# Patient Record
Sex: Male | Born: 1995 | Hispanic: Yes | Marital: Single
Health system: Southern US, Community
[De-identification: ages and names within clinical notes are randomized; demographics above are authoritative.]

---

## 2019-11-30 ENCOUNTER — Emergency Department (HOSPITAL_COMMUNITY): Payer: Self-pay

## 2019-11-30 ENCOUNTER — Encounter (HOSPITAL_COMMUNITY): Payer: Self-pay | Admitting: Emergency Medicine

## 2019-11-30 ENCOUNTER — Emergency Department (HOSPITAL_COMMUNITY)
Admission: EM | Admit: 2019-11-30 | Discharge: 2019-11-30 | Disposition: A | Discharge status: Home / Self Care | Payer: Self-pay | Attending: Emergency Medicine | Admitting: Emergency Medicine

## 2019-11-30 DIAGNOSIS — Y990 Civilian activity done for income or pay: Secondary | ICD-10-CM | POA: Insufficient documentation

## 2019-11-30 DIAGNOSIS — W270XXA Contact with workbench tool, initial encounter: Secondary | ICD-10-CM | POA: Insufficient documentation

## 2019-11-30 DIAGNOSIS — Y9289 Other specified places as the place of occurrence of the external cause: Secondary | ICD-10-CM | POA: Insufficient documentation

## 2019-11-30 DIAGNOSIS — Y9389 Activity, other specified: Secondary | ICD-10-CM | POA: Insufficient documentation

## 2019-11-30 DIAGNOSIS — W500XXA Accidental hit or strike by another person, initial encounter: Secondary | ICD-10-CM | POA: Insufficient documentation

## 2019-11-30 DIAGNOSIS — S61212A Laceration without foreign body of right middle finger without damage to nail, initial encounter: Secondary | ICD-10-CM | POA: Insufficient documentation

## 2019-11-30 DIAGNOSIS — Z23 Encounter for immunization: Secondary | ICD-10-CM | POA: Insufficient documentation

## 2019-11-30 MED ORDER — CEFAZOLIN SODIUM-DEXTROSE 1-4 GM/50ML-% IV SOLN
1.0000 g | Freq: Once | INTRAVENOUS | Status: DC
  Administered 2019-11-30: 1 g via INTRAVENOUS
  Filled 2019-11-30: qty 50

## 2019-11-30 MED ORDER — TETANUS-DIPHTH-ACELL PERTUSSIS 5-2.5-18.5 LF-MCG/0.5 IM SUSP
0.5000 mL | Freq: Once | INTRAMUSCULAR | Status: AC
  Administered 2019-11-30: 0.5 mL via INTRAMUSCULAR
  Filled 2019-11-30: qty 0.5

## 2019-11-30 MED ORDER — LIDOCAINE HCL (PF) 1 % IJ SOLN
10.0000 mL | Freq: Once | INTRAMUSCULAR | Status: AC
  Administered 2019-11-30: 10 mL
  Filled 2019-11-30: qty 30

## 2019-11-30 MED ORDER — CEFAZOLIN SODIUM 1 G IJ SOLR: 1.0000 g | Freq: Once | INTRAMUSCULAR | Status: DC

## 2019-11-30 MED ORDER — CEPHALEXIN 500 MG PO CAPS
500.0000 mg | ORAL_CAPSULE | Freq: Two times a day (BID) | ORAL | 0 refills | Status: AC
Start: 2019-11-30 — End: 2019-12-09
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
## 2019-11-30 NOTE — ED Notes (Signed)
Ortho tech called to apply finger splint.
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
## 2019-11-30 NOTE — Progress Notes (Signed)
Orthopedic Tech Progress Note Patient Details:  Kuper Rennels [facility] March 17, 1996 [id number]  Ortho Devices Type of Ortho Device: Finger splint Ortho Device/Splint Location: right Ortho Device/Splint Interventions: Application   Post Interventions Patient Tolerated: Well Instructions Provided: Care of device   Saul Fordyce 11/30/2019, 3:55 PM

## 2019-11-30 NOTE — ED Notes (Signed)
Discussed discharge instructions and medications with patient. IV removed. All questions addressed. Patient transported home via car by his friend. 

## 2019-11-30 NOTE — Discharge Instructions (Signed)
For a right middle finger laceration and fracture. Do not shower for the first day, tomorrow you can wash it lightly with soap and water. You can alternate between using Tylenol and ibuprofen for pain. It is important that you ice the wound and elevated to keep down the swelling. You were also prescribed Keflex please take as prescribed.  You were referred to a hand surgeon please follow-up with him within the next 7 to 10 days as you need to get the stitches removed. If you do not want to see the surgeon you may see one that is closer to you in [facility] please follow-up with a hand surgeon.  You need to watch your finger and if you experience increased pain, increased redness, or develop a fever need to come back to emergency department for further evaluation

## 2021-06-13 IMAGING — DX DG HAND COMPLETE 3+V*R*
3 series · 3 of 3 positions shown · non-contrast
Comparison: No prior.

CLINICAL DATA: Right hand injury.

EXAM:
RIGHT HAND - COMPLETE 3+ VIEW

[hand ap]
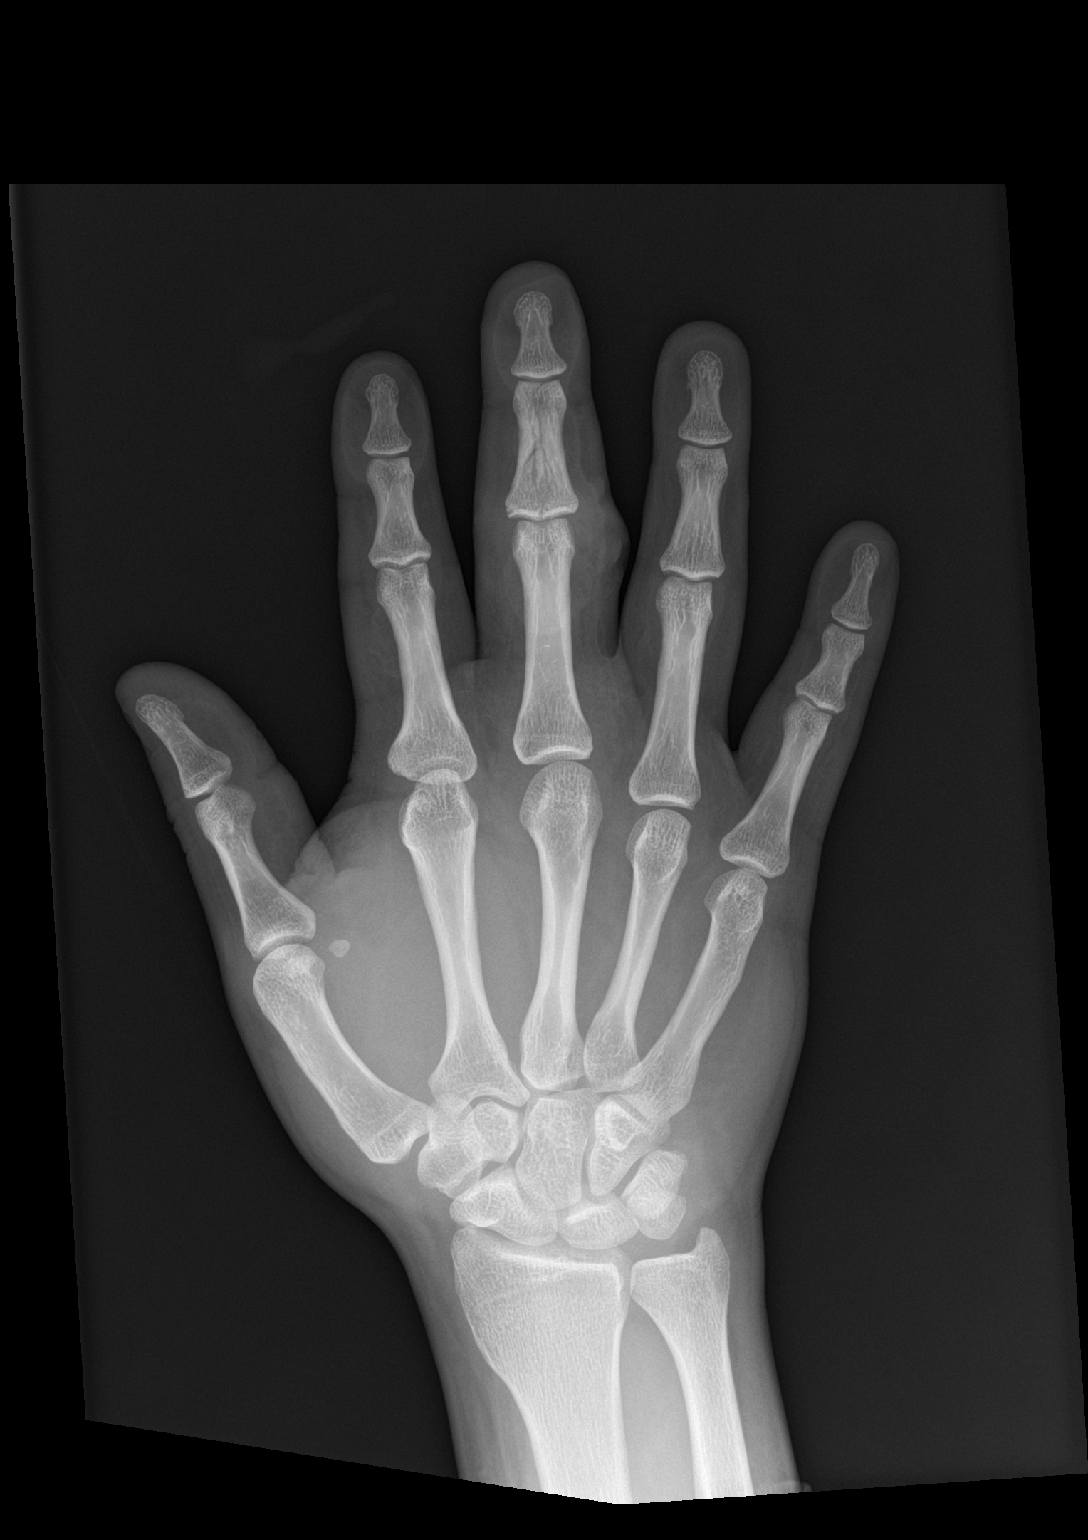

[hand obl]
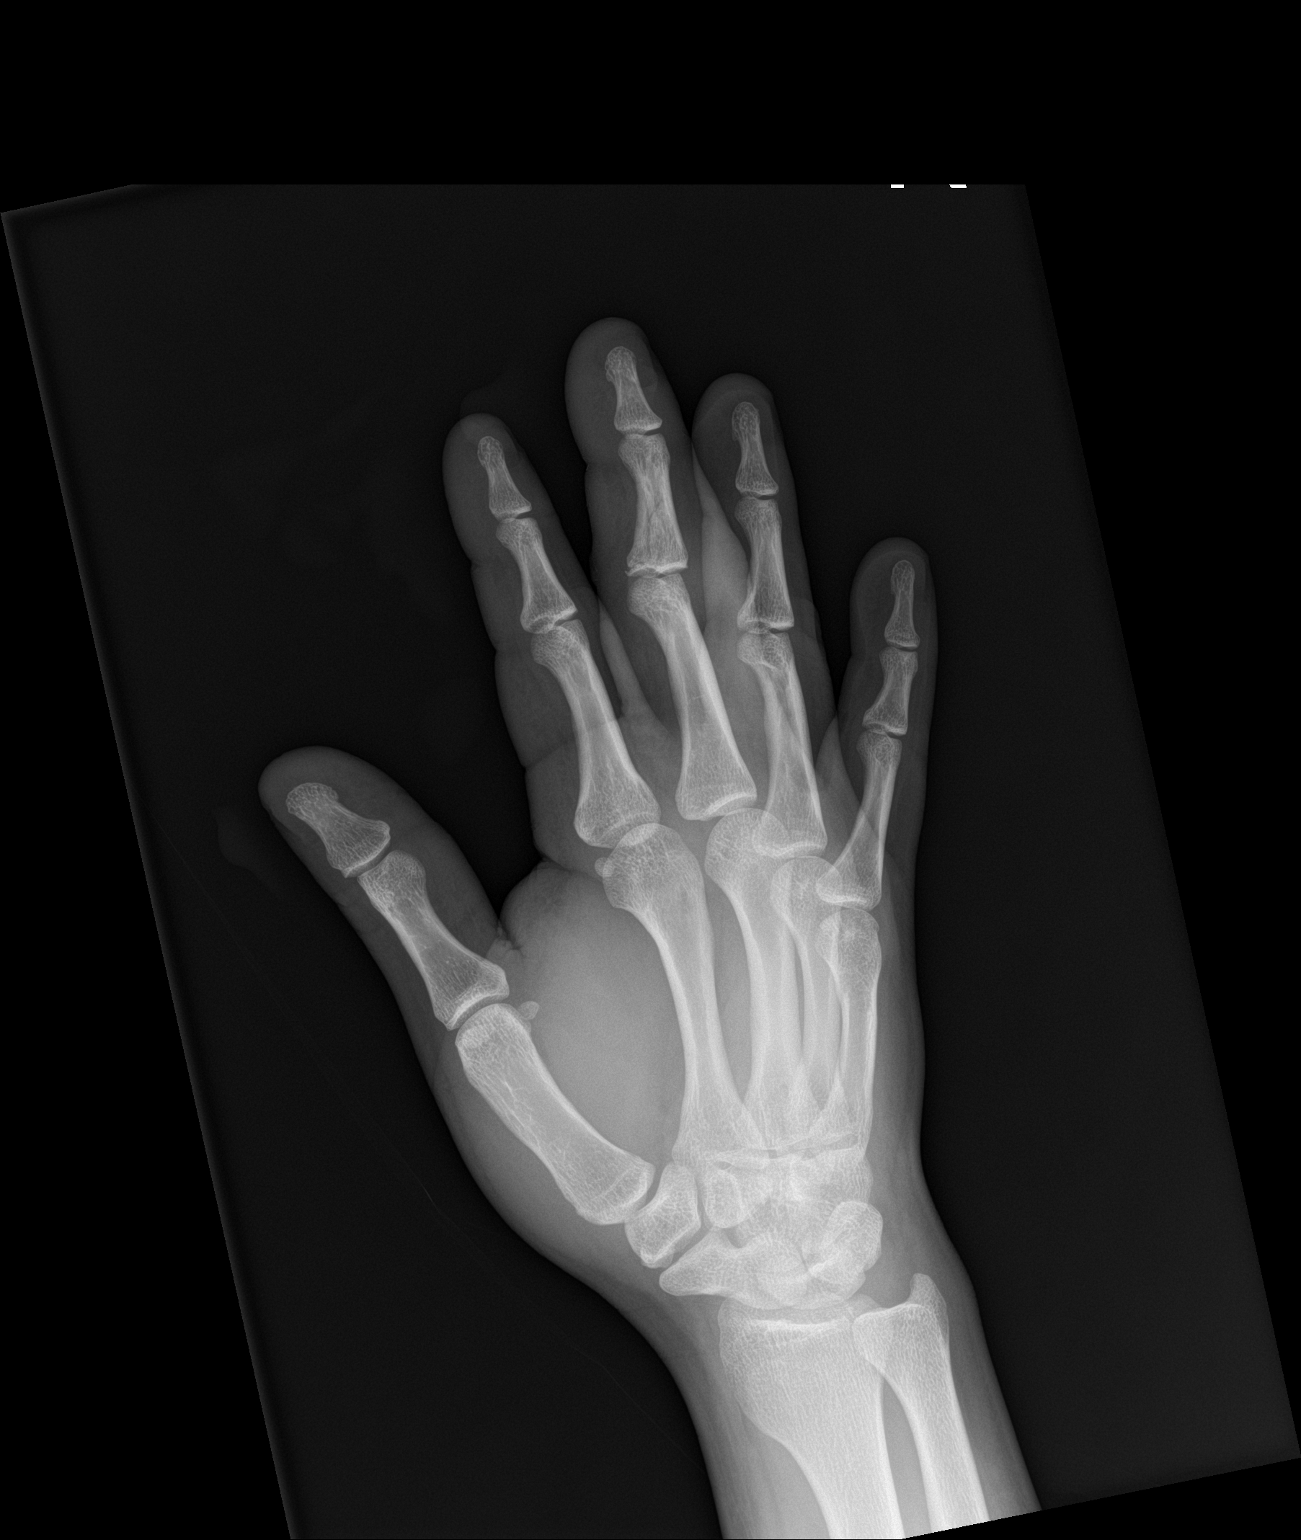

[hand lat]
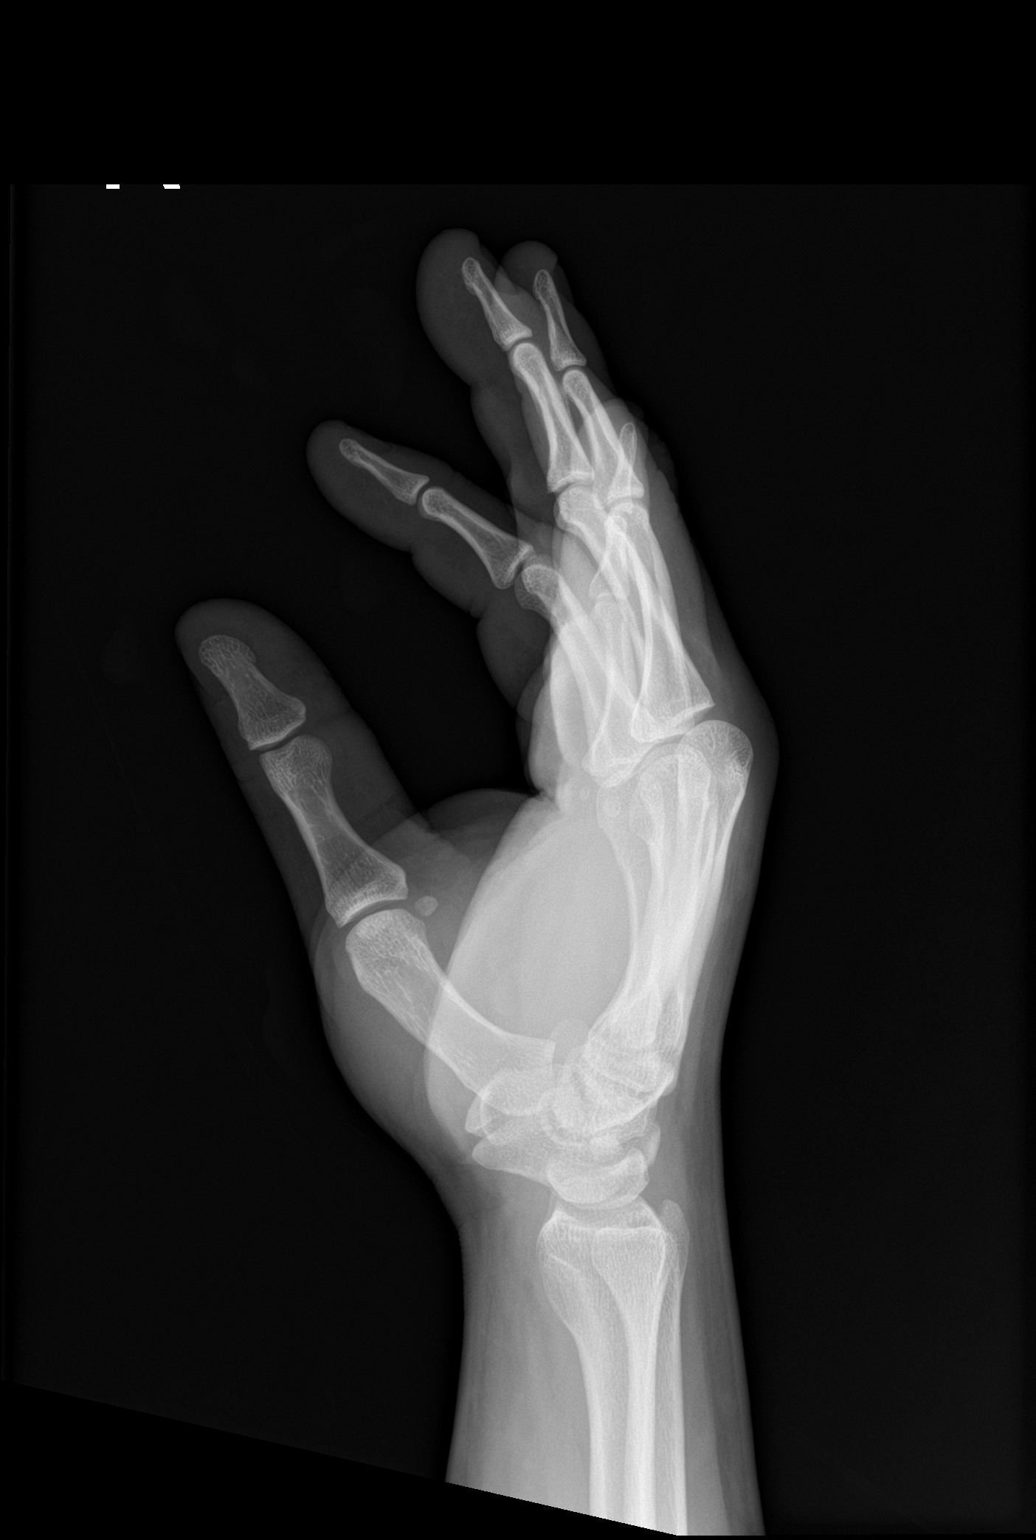

[3 of 3 positions shown; findings below may reference images not displayed]

FINDINGS: Nondisplaced fractures noted throughout the middle phalanx of the
right third digit. Fracture may extend into the proximal and distal
interphalangeal joint. Prominent soft tissue swelling. No radiopaque
foreign body.
IMPRESSION: Nondisplaced fractures noted throughout the middle phalanx of the
right third digit. Extension into the adjacent proximal and distal
interphalangeal joints may be present.
